# Patient Record
Sex: Male | Born: 1970 | Race: Black or African American | Hispanic: No | Marital: Married | Smoking: Never smoker
Health system: Southern US, Community
[De-identification: ages and names within clinical notes are randomized; demographics above are authoritative.]

## PROBLEM LIST (undated history)

## (undated) DIAGNOSIS — I10 Essential (primary) hypertension: Secondary | ICD-10-CM

## (undated) DIAGNOSIS — E785 Hyperlipidemia, unspecified: Secondary | ICD-10-CM

---

## 2016-08-05 ENCOUNTER — Emergency Department (HOSPITAL_COMMUNITY): Payer: PRIVATE HEALTH INSURANCE

## 2016-08-05 ENCOUNTER — Inpatient Hospital Stay (HOSPITAL_COMMUNITY)
Admission: EM | Admit: 2016-08-05 | Discharge: 2016-08-08 | DRG: 185 | Disposition: A | Discharge status: Home / Self Care | Payer: PRIVATE HEALTH INSURANCE | Attending: General Surgery | Admitting: General Surgery

## 2016-08-05 DIAGNOSIS — S2249XA Multiple fractures of ribs, unspecified side, initial encounter for closed fracture: Secondary | ICD-10-CM

## 2016-08-05 DIAGNOSIS — R52 Pain, unspecified: Secondary | ICD-10-CM | POA: Diagnosis not present

## 2016-08-05 DIAGNOSIS — Z7982 Long term (current) use of aspirin: Secondary | ICD-10-CM

## 2016-08-05 DIAGNOSIS — S80212A Abrasion, left knee, initial encounter: Secondary | ICD-10-CM | POA: Diagnosis present

## 2016-08-05 DIAGNOSIS — S2242XA Multiple fractures of ribs, left side, initial encounter for closed fracture: Secondary | ICD-10-CM | POA: Diagnosis not present

## 2016-08-05 DIAGNOSIS — Z79899 Other long term (current) drug therapy: Secondary | ICD-10-CM | POA: Diagnosis not present

## 2016-08-05 DIAGNOSIS — I10 Essential (primary) hypertension: Secondary | ICD-10-CM | POA: Diagnosis present

## 2016-08-05 DIAGNOSIS — S40212A Abrasion of left shoulder, initial encounter: Secondary | ICD-10-CM | POA: Diagnosis present

## 2016-08-05 DIAGNOSIS — Y92410 Unspecified street and highway as the place of occurrence of the external cause: Secondary | ICD-10-CM

## 2016-08-05 LAB — COMPREHENSIVE METABOLIC PANEL
ALBUMIN: 4.6 g/dL (ref 3.5–5.0)
ALT: 55 U/L (ref 17–63)
ANION GAP: 10 (ref 5–15)
AST: 50 U/L — ABNORMAL HIGH (ref 15–41)
Alkaline Phosphatase: 57 U/L (ref 38–126)
BUN: 12 mg/dL (ref 6–20)
CALCIUM: 9.8 mg/dL (ref 8.9–10.3)
CO2: 26 mmol/L (ref 22–32)
CREATININE: 1.34 mg/dL — AB (ref 0.61–1.24)
Chloride: 104 mmol/L (ref 101–111)
GFR calc Af Amer: >60 mL/min (ref >60)
GFR calc non Af Amer: >60 mL/min (ref >60)
GLUCOSE: 141 mg/dL — AB (ref 65–99)
POTASSIUM: 3.6 mmol/L (ref 3.5–5.1)
SODIUM: 140 mmol/L (ref 135–145)
TOTAL PROTEIN: 7.3 g/dL (ref 6.5–8.1)
Total Bilirubin: 1.1 mg/dL (ref 0.3–1.2)

## 2016-08-05 LAB — I-STAT CHEM 8, ED
BUN: 16 mg/dL (ref 6–20)
CHLORIDE: 104 mmol/L (ref 101–111)
CREATININE: 1.3 mg/dL — AB (ref 0.61–1.24)
Calcium, Ion: 1.1 mmol/L — ABNORMAL LOW (ref 1.15–1.40)
GLUCOSE: 141 mg/dL — AB (ref 65–99)
HEMATOCRIT: 44 % (ref 39.0–52.0)
HEMOGLOBIN: 15 g/dL (ref 13.0–17.0)
POTASSIUM: 3.6 mmol/L (ref 3.5–5.1)
Sodium: 141 mmol/L (ref 135–145)
TCO2: 26 mmol/L (ref 0–100)

## 2016-08-05 LAB — CBC
HCT: 41.6 % (ref 39.0–52.0)
HEMOGLOBIN: 14.1 g/dL (ref 13.0–17.0)
MCH: 27.8 pg (ref 26.0–34.0)
MCHC: 33.9 g/dL (ref 30.0–36.0)
MCV: 82.1 fL (ref 78.0–100.0)
PLATELETS: 147 10*3/uL — AB (ref 150–400)
RBC: 5.07 MIL/uL (ref 4.22–5.81)
RDW: 13.8 % (ref 11.5–15.5)
WBC: 14.6 10*3/uL — ABNORMAL HIGH (ref 4.0–10.5)

## 2016-08-05 LAB — SAMPLE TO BLOOD BANK

## 2016-08-05 LAB — I-STAT CG4 LACTIC ACID, ED: LACTIC ACID, VENOUS: 2.7 mmol/L — AB (ref 0.5–1.9)

## 2016-08-05 LAB — PROTIME-INR
INR: 1.01
Prothrombin Time: 13.3 seconds (ref 11.4–15.2)

## 2016-08-05 LAB — ETHANOL: Alcohol, Ethyl (B): <5 mg/dL (ref <5)

## 2016-08-05 MED ORDER — HYDROMORPHONE HCL 1 MG/ML IJ SOLN
INTRAMUSCULAR | Status: AC
  Filled 2016-08-05: qty 1

## 2016-08-05 MED ORDER — HYDROMORPHONE HCL 1 MG/ML IJ SOLN
1.0000 mg | Freq: Once | INTRAMUSCULAR | Status: AC
  Administered 2016-08-05: 1 mg via INTRAVENOUS

## 2016-08-05 MED ORDER — FENTANYL CITRATE (PF) 100 MCG/2ML IJ SOLN
INTRAMUSCULAR | Status: AC
  Administered 2016-08-05: 100 ug
  Filled 2016-08-05: qty 2

## 2016-08-05 MED ORDER — SODIUM CHLORIDE 0.9 % IV BOLUS (SEPSIS)
1000.0000 mL | Freq: Once | INTRAVENOUS | Status: AC
  Administered 2016-08-05: 1000 mL via INTRAVENOUS

## 2016-08-05 MED ORDER — IOPAMIDOL (ISOVUE-300) INJECTION 61%
INTRAVENOUS | Status: AC
  Administered 2016-08-05: 100 mL
  Filled 2016-08-05: qty 100

## 2016-08-05 NOTE — Progress Notes (Addendum)
Chaplain responded to page.  Patient being taken care of by care team.  No family with patient at this time.  Spiritual Care services remain available should anyone need assistance.    08/05/16 2019  Clinical Encounter Type  Visited With Patient;Patient not available;Health care provider  Visit Type Initial;Trauma
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
## 2016-08-05 NOTE — ED Notes (Signed)
Pt to CT
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
## 2016-08-06 LAB — BASIC METABOLIC PANEL
Anion gap: 10 (ref 5–15)
BUN: 12 mg/dL (ref 6–20)
CHLORIDE: 102 mmol/L (ref 101–111)
CO2: 26 mmol/L (ref 22–32)
CREATININE: 1.16 mg/dL (ref 0.61–1.24)
Calcium: 9.2 mg/dL (ref 8.9–10.3)
GFR calc Af Amer: >60 mL/min (ref >60)
GFR calc non Af Amer: >60 mL/min (ref >60)
GLUCOSE: 144 mg/dL — AB (ref 65–99)
POTASSIUM: 4.1 mmol/L (ref 3.5–5.1)
Sodium: 138 mmol/L (ref 135–145)

## 2016-08-06 LAB — URINALYSIS, ROUTINE W REFLEX MICROSCOPIC
BILIRUBIN URINE: NEGATIVE
GLUCOSE, UA: NEGATIVE mg/dL
Hgb urine dipstick: NEGATIVE
KETONES UR: NEGATIVE mg/dL
LEUKOCYTES UA: NEGATIVE
Nitrite: NEGATIVE
PROTEIN: NEGATIVE mg/dL
Specific Gravity, Urine: 1.024 (ref 1.005–1.030)
pH: 5 (ref 5.0–8.0)

## 2016-08-06 LAB — CBC
HCT: 39.6 % (ref 39.0–52.0)
HEMOGLOBIN: 13.8 g/dL (ref 13.0–17.0)
MCH: 28.7 pg (ref 26.0–34.0)
MCHC: 34.8 g/dL (ref 30.0–36.0)
MCV: 82.3 fL (ref 78.0–100.0)
Platelets: 133 10*3/uL — ABNORMAL LOW (ref 150–400)
RBC: 4.81 MIL/uL (ref 4.22–5.81)
RDW: 14.3 % (ref 11.5–15.5)
WBC: 11.1 10*3/uL — ABNORMAL HIGH (ref 4.0–10.5)

## 2016-08-06 LAB — HIV ANTIBODY (ROUTINE TESTING W REFLEX): HIV Screen 4th Generation wRfx: NONREACTIVE

## 2016-08-06 MED ORDER — ONDANSETRON HCL 4 MG PO TABS: 4.0000 mg | ORAL_TABLET | Freq: Four times a day (QID) | ORAL | Status: DC | PRN

## 2016-08-06 MED ORDER — OXYCODONE HCL 5 MG PO TABS: 10.0000 mg | ORAL_TABLET | ORAL | Status: DC | PRN

## 2016-08-06 MED ORDER — LOSARTAN POTASSIUM 50 MG PO TABS
100.0000 mg | ORAL_TABLET | Freq: Every day | ORAL | Status: DC
  Administered 2016-08-06 – 2016-08-07 (×2): 100 mg via ORAL
  Filled 2016-08-06 (×2): qty 2

## 2016-08-06 MED ORDER — SIMVASTATIN 40 MG PO TABS: 40.0000 mg | ORAL_TABLET | Freq: Every day | ORAL | Status: DC

## 2016-08-06 MED ORDER — AMLODIPINE BESYLATE 5 MG PO TABS
5.0000 mg | ORAL_TABLET | Freq: Every day | ORAL | Status: DC
  Administered 2016-08-06 – 2016-08-07 (×2): 5 mg via ORAL
  Filled 2016-08-06 (×2): qty 1

## 2016-08-06 MED ORDER — ASPIRIN EC 81 MG PO TBEC
81.0000 mg | DELAYED_RELEASE_TABLET | Freq: Every day | ORAL | Status: DC
  Administered 2016-08-06 – 2016-08-07 (×2): 81 mg via ORAL
  Filled 2016-08-06 (×2): qty 1

## 2016-08-06 MED ORDER — LOSARTAN POTASSIUM-HCTZ 100-12.5 MG PO TABS: 1.0000 | ORAL_TABLET | Freq: Every day | ORAL | Status: DC

## 2016-08-06 MED ORDER — ACETAMINOPHEN 325 MG PO TABS: 650.0000 mg | ORAL_TABLET | ORAL | Status: DC | PRN

## 2016-08-06 MED ORDER — MONTELUKAST SODIUM 10 MG PO TABS
10.0000 mg | ORAL_TABLET | Freq: Every day | ORAL | Status: DC
  Administered 2016-08-06 – 2016-08-07 (×2): 10 mg via ORAL
  Filled 2016-08-06 (×2): qty 1

## 2016-08-06 MED ORDER — OXYCODONE HCL 5 MG PO TABS: 5.0000 mg | ORAL_TABLET | ORAL | Status: DC | PRN

## 2016-08-06 MED ORDER — METHOCARBAMOL 500 MG PO TABS
1000.0000 mg | ORAL_TABLET | Freq: Four times a day (QID) | ORAL | Status: DC
  Administered 2016-08-06 – 2016-08-07 (×7): 1000 mg via ORAL
  Filled 2016-08-06 (×7): qty 2

## 2016-08-06 MED ORDER — HYDROMORPHONE HCL 1 MG/ML IJ SOLN
1.0000 mg | INTRAMUSCULAR | Status: DC | PRN
  Administered 2016-08-06 – 2016-08-07 (×5): 1 mg via INTRAVENOUS
  Filled 2016-08-06 (×5): qty 1

## 2016-08-06 MED ORDER — ATORVASTATIN CALCIUM 20 MG PO TABS
20.0000 mg | ORAL_TABLET | Freq: Every day | ORAL | Status: DC
  Administered 2016-08-06: 20 mg via ORAL
  Filled 2016-08-06: qty 1

## 2016-08-06 MED ORDER — HYDROCHLOROTHIAZIDE 12.5 MG PO CAPS
12.5000 mg | ORAL_CAPSULE | Freq: Every day | ORAL | Status: DC
  Administered 2016-08-06 – 2016-08-07 (×2): 12.5 mg via ORAL
  Filled 2016-08-06 (×2): qty 1

## 2016-08-06 MED ORDER — OXYCODONE HCL 5 MG PO TABS
5.0000 mg | ORAL_TABLET | ORAL | Status: DC | PRN
  Administered 2016-08-07: 5 mg via ORAL
  Administered 2016-08-07 – 2016-08-08 (×2): 10 mg via ORAL
  Filled 2016-08-06 (×4): qty 2

## 2016-08-06 MED ORDER — METHOCARBAMOL 1000 MG/10ML IJ SOLN
1000.0000 mg | Freq: Three times a day (TID) | INTRAVENOUS | Status: DC | PRN
  Administered 2016-08-06: 1000 mg via INTRAVENOUS
  Filled 2016-08-06 (×2): qty 10

## 2016-08-06 MED ORDER — ENOXAPARIN SODIUM 40 MG/0.4ML ~~LOC~~ SOLN
40.0000 mg | SUBCUTANEOUS | Status: DC
  Administered 2016-08-06: 40 mg via SUBCUTANEOUS
  Filled 2016-08-06: qty 0.4

## 2016-08-06 MED ORDER — ONDANSETRON HCL 4 MG/2ML IJ SOLN: 4.0000 mg | Freq: Four times a day (QID) | INTRAMUSCULAR | Status: DC | PRN

## 2016-08-06 MED ORDER — ACETAMINOPHEN 500 MG PO TABS
1000.0000 mg | ORAL_TABLET | Freq: Three times a day (TID) | ORAL | Status: DC
  Administered 2016-08-06 – 2016-08-08 (×6): 1000 mg via ORAL
  Filled 2016-08-06 (×6): qty 2

## 2016-08-06 MED ORDER — KCL IN DEXTROSE-NACL 20-5-0.45 MEQ/L-%-% IV SOLN
INTRAVENOUS | Status: DC
  Administered 2016-08-06 – 2016-08-07 (×3): via INTRAVENOUS
  Filled 2016-08-06 (×3): qty 1000

## 2016-08-06 MED ORDER — GABAPENTIN 600 MG PO TABS
300.0000 mg | ORAL_TABLET | Freq: Three times a day (TID) | ORAL | Status: DC
  Administered 2016-08-06 – 2016-08-07 (×6): 300 mg via ORAL
  Filled 2016-08-06 (×6): qty 1
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
## 2016-08-07 ENCOUNTER — Inpatient Hospital Stay (HOSPITAL_COMMUNITY): Payer: PRIVATE HEALTH INSURANCE

## 2016-08-07 MED ORDER — ENOXAPARIN SODIUM 80 MG/0.8ML ~~LOC~~ SOLN
65.0000 mg | SUBCUTANEOUS | Status: DC
  Administered 2016-08-07: 65 mg via SUBCUTANEOUS
  Filled 2016-08-07 (×2): qty 0.8
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
## 2016-08-07 NOTE — Progress Notes (Signed)
Physical Therapy Discharge Patient Details Name: Louis Martin MRN: [id number] DOB: 01/16/1971 Today's Date: 08/07/2016 Time: 1023-1048 PT Time Calculation (min) (ACUTE ONLY): 25 min  Patient discharged from PT services secondary to goals met and no further PT needs identified.  Please see latest therapy progress note for current level of functioning and progress toward goals.    Progress and discharge plan discussed with patient and/or caregiver: Patient/Caregiver agrees with plan  GP    Elizabeth B. Van Fleet PT, DPT Acute Rehabilitation  [phone] Pager [phone]   Elizabeth B Van Fleet 08/07/2016, 11:05 AM  

## 2016-08-08 MED ORDER — GABAPENTIN 600 MG PO TABS: 300.0000 mg | ORAL_TABLET | Freq: Three times a day (TID) | ORAL | 0 refills | Status: AC

## 2016-08-08 MED ORDER — METHOCARBAMOL 500 MG PO TABS: 1000.0000 mg | ORAL_TABLET | Freq: Four times a day (QID) | ORAL | 0 refills | Status: AC

## 2016-08-08 MED ORDER — ACETAMINOPHEN 500 MG PO TABS: 1000.0000 mg | ORAL_TABLET | Freq: Three times a day (TID) | ORAL | 1 refills | Status: AC

## 2016-08-08 MED ORDER — OXYCODONE HCL 5 MG PO TABS: 5.0000 mg | ORAL_TABLET | Freq: Three times a day (TID) | ORAL | 0 refills | Status: AC | PRN

## 2016-08-08 NOTE — Discharge Instructions (Signed)
Use Vaseline/Vaseline gauze on shoulder abrasion so that it does not stick to wound. Ok to keep open for periods during the day. Ok to shower with wounds open Monitor wound for spreading redness Ice/heat, elevate, and compression hose for lower extremity swelling Call with any questions or concerns
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
## 2016-08-08 NOTE — Discharge Planning (Signed)
Patient discharged home in stable condition. Verbalizes understanding of all discharge instructions, including home medications and follow up appointments. 

## 2016-08-24 ENCOUNTER — Ambulatory Visit (HOSPITAL_COMMUNITY)
Admission: RE | Admit: 2016-08-24 | Discharge: 2016-08-24 | Disposition: A | Discharge status: Home / Self Care | Payer: PRIVATE HEALTH INSURANCE | Source: Ambulatory Visit | Attending: Physician Assistant | Admitting: Physician Assistant

## 2016-08-24 ENCOUNTER — Other Ambulatory Visit (HOSPITAL_COMMUNITY): Payer: Self-pay | Admitting: Physician Assistant

## 2016-08-24 DIAGNOSIS — M79662 Pain in left lower leg: Secondary | ICD-10-CM | POA: Diagnosis present

## 2016-08-24 DIAGNOSIS — M7989 Other specified soft tissue disorders: Secondary | ICD-10-CM

## 2016-08-24 DIAGNOSIS — L989 Disorder of the skin and subcutaneous tissue, unspecified: Secondary | ICD-10-CM | POA: Diagnosis not present

## 2016-08-24 DIAGNOSIS — M79605 Pain in left leg: Secondary | ICD-10-CM | POA: Diagnosis not present

## 2016-08-24 NOTE — Progress Notes (Signed)
*  PRELIMINARY RESULTS* Vascular Ultrasound Left lower extremity venous duplex has been completed.  Preliminary findings: No evidence of deep vein thrombosis in the visualized veins of the left lower extremity.  Complex hypoechoic/heterogeneous lesion seen medial left calf, area of swelling- possible hematoma? No obvious baker's cyst seen in the left popliteal fossa.  Preliminary results called to [facility] Miller @ 11:45   Louis Martin 08/24/2016, 11:45 AM

## 2016-09-14 ENCOUNTER — Other Ambulatory Visit (HOSPITAL_COMMUNITY): Payer: Self-pay | Admitting: General Surgery

## 2016-09-14 DIAGNOSIS — M79605 Pain in left leg: Secondary | ICD-10-CM

## 2016-09-15 ENCOUNTER — Other Ambulatory Visit (HOSPITAL_COMMUNITY): Payer: Self-pay | Admitting: General Surgery

## 2016-09-15 ENCOUNTER — Emergency Department (HOSPITAL_COMMUNITY)
Admission: EM | Admit: 2016-09-15 | Discharge: 2016-09-15 | Disposition: A | Discharge status: Home / Self Care | Payer: PRIVATE HEALTH INSURANCE | Source: Home / Self Care

## 2016-09-15 ENCOUNTER — Ambulatory Visit (HOSPITAL_COMMUNITY)
Admission: RE | Admit: 2016-09-15 | Discharge: 2016-09-15 | Disposition: A | Discharge status: Home / Self Care | Payer: PRIVATE HEALTH INSURANCE | Source: Ambulatory Visit | Attending: General Surgery | Admitting: General Surgery

## 2016-09-15 ENCOUNTER — Encounter (HOSPITAL_COMMUNITY): Payer: Self-pay | Admitting: *Deleted

## 2016-09-15 ENCOUNTER — Telehealth (INDEPENDENT_AMBULATORY_CARE_PROVIDER_SITE_OTHER): Payer: Self-pay | Admitting: General Surgery

## 2016-09-15 DIAGNOSIS — Z5321 Procedure and treatment not carried out due to patient leaving prior to being seen by health care provider: Secondary | ICD-10-CM | POA: Insufficient documentation

## 2016-09-15 DIAGNOSIS — R600 Localized edema: Secondary | ICD-10-CM | POA: Insufficient documentation

## 2016-09-15 DIAGNOSIS — M79605 Pain in left leg: Secondary | ICD-10-CM

## 2016-09-15 HISTORY — DX: Hyperlipidemia, unspecified: E78.5

## 2016-09-15 HISTORY — DX: Essential (primary) hypertension: I10

## 2016-09-15 LAB — CBC WITH DIFFERENTIAL/PLATELET
BASOS ABS: 0 10*3/uL (ref 0.0–0.1)
BASOS PCT: 0 %
EOS ABS: 0.1 10*3/uL (ref 0.0–0.7)
EOS PCT: 1 %
HCT: 40.7 % (ref 39.0–52.0)
Hemoglobin: 13.7 g/dL (ref 13.0–17.0)
Lymphocytes Relative: 26 %
Lymphs Abs: 1.7 10*3/uL (ref 0.7–4.0)
MCH: 27.7 pg (ref 26.0–34.0)
MCHC: 33.7 g/dL (ref 30.0–36.0)
MCV: 82.4 fL (ref 78.0–100.0)
MONO ABS: 0.6 10*3/uL (ref 0.1–1.0)
Monocytes Relative: 10 %
Neutro Abs: 4.1 10*3/uL (ref 1.7–7.7)
Neutrophils Relative %: 63 %
PLATELETS: 171 10*3/uL (ref 150–400)
RBC: 4.94 MIL/uL (ref 4.22–5.81)
RDW: 13.8 % (ref 11.5–15.5)
WBC: 6.5 10*3/uL (ref 4.0–10.5)

## 2016-09-15 LAB — I-STAT CG4 LACTIC ACID, ED: Lactic Acid, Venous: 1.41 mmol/L (ref 0.5–1.9)

## 2016-09-15 LAB — COMPREHENSIVE METABOLIC PANEL
ALBUMIN: 4.3 g/dL (ref 3.5–5.0)
ALK PHOS: 130 U/L — AB (ref 38–126)
ALT: 51 U/L (ref 17–63)
AST: 33 U/L (ref 15–41)
Anion gap: 7 (ref 5–15)
BILIRUBIN TOTAL: 0.6 mg/dL (ref 0.3–1.2)
BUN: 12 mg/dL (ref 6–20)
CALCIUM: 9.6 mg/dL (ref 8.9–10.3)
CO2: 30 mmol/L (ref 22–32)
CREATININE: 1.31 mg/dL — AB (ref 0.61–1.24)
Chloride: 101 mmol/L (ref 101–111)
GFR calc Af Amer: >60 mL/min (ref >60)
GFR calc non Af Amer: >60 mL/min (ref >60)
GLUCOSE: 113 mg/dL — AB (ref 65–99)
Potassium: 4.1 mmol/L (ref 3.5–5.1)
Sodium: 138 mmol/L (ref 135–145)
TOTAL PROTEIN: 7.3 g/dL (ref 6.5–8.1)

## 2016-09-15 LAB — POCT I-STAT CREATININE: Creatinine, Ser: 0.9 mg/dL (ref 0.61–1.24)

## 2016-09-15 MED ORDER — GADOBENATE DIMEGLUMINE 529 MG/ML IV SOLN
20.0000 mL | Freq: Once | INTRAVENOUS | Status: AC | PRN
  Administered 2016-09-15: 20 mL via INTRAVENOUS

## 2016-09-15 MED ORDER — OXYCODONE HCL 5 MG PO TABS: 5.0000 mg | ORAL_TABLET | Freq: Four times a day (QID) | ORAL | 0 refills | Status: AC | PRN

## 2016-09-15 NOTE — Progress Notes (Signed)
Patient sent over from the office because an MRI scan was done which demonstrated possble cellulitis of the LLE.  Clinically he has pain in the left calf, but no clinical cellulitis.  He is afebrile.  He has not systemic symptoms..  He has a firm hematoma of the left calf and edema of the entire LLE below the knee.  This is a calf hematoma I evolution, but it does not appear to be infected or does not appear to have cellulitis.  Needs to keep the leg elevated above his heart, use a compression stocking, try to stay off of it as much as possible, and take pain medications as needed, preferably Tylenol and not NSAIDs.  Thie discoloration of his LLE is part of the evolving hematoma.  He has norma sensation , normal motor function and normal pulses.  He does not have a compartment syndrome.  He has no skin that appears to be at risk for necrosis.  Patient needs follow up and I will have him come in to see me on June 22 late morning.  Marta LamasJames O. Gae BonWyatt, III, MD, FACS [phone] Trauma Surgeon

## 2016-09-15 NOTE — Telephone Encounter (Signed)
Results of LLE MRI were concerning for cellulitis. Spoke with Dr. Derrell LollingIngram who [facility] by trauma surgeon on call. I called and spoke with the patients wife who stated she would take the pt to the ER this evening to have his LLE evaluated for cellulitis. Informed Dr. Lindie SpruceWyatt that the pt is on his way to the ER.   Louis MarlinJessica Devell Martin, [facility] Pager [phone]
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
## 2021-03-22 ENCOUNTER — Emergency Department (HOSPITAL_COMMUNITY)
Admission: EM | Admit: 2021-03-22 | Discharge: 2021-03-22 | Disposition: A | Discharge status: Home / Self Care | Payer: PRIVATE HEALTH INSURANCE | Attending: Emergency Medicine | Admitting: Emergency Medicine

## 2021-03-22 ENCOUNTER — Encounter (HOSPITAL_COMMUNITY): Payer: Self-pay | Admitting: Emergency Medicine

## 2021-03-22 ENCOUNTER — Other Ambulatory Visit: Payer: Self-pay

## 2021-03-22 ENCOUNTER — Emergency Department (HOSPITAL_COMMUNITY): Payer: Self-pay

## 2021-03-22 DIAGNOSIS — R739 Hyperglycemia, unspecified: Secondary | ICD-10-CM

## 2021-03-22 DIAGNOSIS — Z7982 Long term (current) use of aspirin: Secondary | ICD-10-CM | POA: Insufficient documentation

## 2021-03-22 DIAGNOSIS — R350 Frequency of micturition: Secondary | ICD-10-CM | POA: Insufficient documentation

## 2021-03-22 DIAGNOSIS — R1084 Generalized abdominal pain: Secondary | ICD-10-CM

## 2021-03-22 DIAGNOSIS — Z79899 Other long term (current) drug therapy: Secondary | ICD-10-CM | POA: Insufficient documentation

## 2021-03-22 DIAGNOSIS — R7989 Other specified abnormal findings of blood chemistry: Secondary | ICD-10-CM

## 2021-03-22 DIAGNOSIS — I10 Essential (primary) hypertension: Secondary | ICD-10-CM | POA: Insufficient documentation

## 2021-03-22 LAB — COMPREHENSIVE METABOLIC PANEL
ALT: 101 U/L — ABNORMAL HIGH (ref 0–44)
AST: 45 U/L — ABNORMAL HIGH (ref 15–41)
Albumin: 4.6 g/dL (ref 3.5–5.0)
Alkaline Phosphatase: 153 U/L — ABNORMAL HIGH (ref 38–126)
Anion gap: 15 (ref 5–15)
BUN: 22 mg/dL — ABNORMAL HIGH (ref 6–20)
CO2: 26 mmol/L (ref 22–32)
Calcium: 9.8 mg/dL (ref 8.9–10.3)
Chloride: 85 mmol/L — ABNORMAL LOW (ref 98–111)
Creatinine, Ser: 1.34 mg/dL — ABNORMAL HIGH (ref 0.61–1.24)
GFR, Estimated: >60 mL/min (ref >60)
Glucose, Bld: 618 mg/dL (ref 70–99)
Potassium: 4.4 mmol/L (ref 3.5–5.1)
Sodium: 126 mmol/L — ABNORMAL LOW (ref 135–145)
Total Bilirubin: 0.9 mg/dL (ref 0.3–1.2)
Total Protein: 8.4 g/dL — ABNORMAL HIGH (ref 6.5–8.1)

## 2021-03-22 LAB — CBC WITH DIFFERENTIAL/PLATELET
Abs Immature Granulocytes: 0.01 10*3/uL (ref 0.00–0.07)
Basophils Absolute: 0 10*3/uL (ref 0.0–0.1)
Basophils Relative: 0 %
Eosinophils Absolute: 0 10*3/uL (ref 0.0–0.5)
Eosinophils Relative: 1 %
HCT: 41.3 % (ref 39.0–52.0)
Hemoglobin: 14.1 g/dL (ref 13.0–17.0)
Immature Granulocytes: 0 %
Lymphocytes Relative: 21 %
Lymphs Abs: 1.5 10*3/uL (ref 0.7–4.0)
MCH: 25.6 pg — ABNORMAL LOW (ref 26.0–34.0)
MCHC: 34.1 g/dL (ref 30.0–36.0)
MCV: 75.1 fL — ABNORMAL LOW (ref 80.0–100.0)
Monocytes Absolute: 0.6 10*3/uL (ref 0.1–1.0)
Monocytes Relative: 8 %
Neutro Abs: 5 10*3/uL (ref 1.7–7.7)
Neutrophils Relative %: 70 %
Platelets: 152 10*3/uL (ref 150–400)
RBC: 5.5 MIL/uL (ref 4.22–5.81)
RDW: 15.7 % — ABNORMAL HIGH (ref 11.5–15.5)
WBC: 7.2 10*3/uL (ref 4.0–10.5)
nRBC: 0 % (ref 0.0–0.2)

## 2021-03-22 LAB — URINALYSIS, MICROSCOPIC (REFLEX)
Bacteria, UA: NONE SEEN
RBC / HPF: NONE SEEN RBC/hpf (ref 0–5)
Squamous Epithelial / HPF: NONE SEEN (ref 0–5)
WBC, UA: NONE SEEN WBC/hpf (ref 0–5)

## 2021-03-22 LAB — URINALYSIS, ROUTINE W REFLEX MICROSCOPIC
Bilirubin Urine: NEGATIVE
Glucose, UA: >=500 mg/dL — AB
Hgb urine dipstick: NEGATIVE
Ketones, ur: NEGATIVE mg/dL
Leukocytes,Ua: NEGATIVE
Nitrite: NEGATIVE
Protein, ur: NEGATIVE mg/dL
Specific Gravity, Urine: <1.005 — ABNORMAL LOW (ref 1.005–1.030)
pH: 5 (ref 5.0–8.0)

## 2021-03-22 LAB — CBG MONITORING, ED
Glucose-Capillary: 417 mg/dL — ABNORMAL HIGH (ref 70–99)
Glucose-Capillary: 423 mg/dL — ABNORMAL HIGH (ref 70–99)

## 2021-03-22 LAB — LIPASE, BLOOD: Lipase: 45 U/L (ref 11–51)

## 2021-03-22 MED ORDER — INSULIN ASPART PROT & ASPART (70-30 MIX) 100 UNIT/ML PEN
20.0000 [IU] | PEN_INJECTOR | Freq: Two times a day (BID) | SUBCUTANEOUS | 0 refills | Status: AC
Start: 2021-03-22 — End: ?

## 2021-03-22 MED ORDER — INSULIN ASPART 100 UNIT/ML IJ SOLN
18.0000 [IU] | Freq: Once | INTRAMUSCULAR | Status: AC
  Administered 2021-03-22: 07:00:00 18 [IU] via SUBCUTANEOUS
  Filled 2021-03-22: qty 1

## 2021-03-22 MED ORDER — FAMOTIDINE IN NACL 20-0.9 MG/50ML-% IV SOLN
20.0000 mg | Freq: Once | INTRAVENOUS | Status: AC
  Administered 2021-03-22: 03:00:00 20 mg via INTRAVENOUS
  Filled 2021-03-22: qty 50

## 2021-03-22 MED ORDER — MORPHINE SULFATE (PF) 4 MG/ML IV SOLN
4.0000 mg | Freq: Once | INTRAVENOUS | Status: AC
  Administered 2021-03-22: 03:00:00 4 mg via INTRAVENOUS
  Filled 2021-03-22: qty 1

## 2021-03-22 MED ORDER — SODIUM CHLORIDE 0.9 % IV BOLUS
500.0000 mL | Freq: Once | INTRAVENOUS | Status: AC
  Administered 2021-03-22: 07:00:00 500 mL via INTRAVENOUS

## 2021-03-22 MED ORDER — INSULIN STARTER KIT- SYRINGES (ENGLISH)
1.0000 | Freq: Once | Status: DC
  Filled 2021-03-22: qty 1

## 2021-03-22 MED ORDER — SODIUM CHLORIDE 0.9 % IV BOLUS
1000.0000 mL | Freq: Once | INTRAVENOUS | Status: AC
  Administered 2021-03-22: 05:00:00 1000 mL via INTRAVENOUS

## 2021-03-22 MED ORDER — BLOOD GLUCOSE MONITOR KIT: PACK | 0 refills | Status: AC

## 2021-03-22 MED ORDER — GLIPIZIDE 5 MG PO TABS: 5.0000 mg | ORAL_TABLET | Freq: Every day | ORAL | 1 refills | Status: AC

## 2021-03-22 MED ORDER — SODIUM CHLORIDE 0.9 % IV BOLUS
1000.0000 mL | Freq: Once | INTRAVENOUS | Status: AC
  Administered 2021-03-22: 04:00:00 1000 mL via INTRAVENOUS

## 2021-03-22 MED ORDER — METFORMIN HCL 500 MG PO TABS: 1000.0000 mg | ORAL_TABLET | Freq: Two times a day (BID) | ORAL | 1 refills | Status: AC

## 2021-03-22 MED ORDER — METFORMIN HCL 500 MG PO TABS: 500.0000 mg | ORAL_TABLET | Freq: Two times a day (BID) | ORAL | 1 refills | Status: DC

## 2021-03-22 MED ORDER — METFORMIN HCL 500 MG PO TABS: 500.0000 mg | ORAL_TABLET | Freq: Two times a day (BID) | ORAL | 6 refills | Status: DC

## 2021-03-22 MED ORDER — ONDANSETRON HCL 4 MG/2ML IJ SOLN
4.0000 mg | Freq: Once | INTRAMUSCULAR | Status: AC
  Administered 2021-03-22: 03:00:00 4 mg via INTRAVENOUS
  Filled 2021-03-22: qty 2

## 2021-03-22 MED ORDER — INSULIN ASPART 100 UNIT/ML IJ SOLN
10.0000 [IU] | Freq: Once | INTRAMUSCULAR | Status: AC
  Administered 2021-03-22: 05:00:00 10 [IU] via INTRAVENOUS
  Filled 2021-03-22: qty 1

## 2021-03-22 NOTE — ED Notes (Signed)
Blood glucose of 618 reported to Dr Blinda Leatherwood

## 2021-03-22 NOTE — ED Provider Notes (Addendum)
°  Physical Exam  BP 130/90    Pulse 85    Temp 97.8 F (36.6 C) (Oral)    Resp 13    Ht 6\' 1"  (1.854 m)    Wt 133 kg    SpO2 95%    BMI 38.68 kg/m   Physical Exam  ED Course/Procedures     Procedures  MDM   Patient was awaiting ultrasound with elevated LFTs. Ultrasound shows some liver disease pathology.  Results discussed with the patient.  He is stable for discharge. PCP follow-up recommended for repeat LFTs.    Marland Kitchen, MD 03/22/21 0851   9:55 AM  Inpatient Diabetes Recommendations per 03/24/21 RN:  Metformin 1000 mg BID Glipizide 5 mg BID 70/30 15 units BID per MD decision, F/U with PCP ASAP.  Patient says he is agreeable to insulin.  I confirmed - pt is in agreement. Has appt with pcp in Jan. Aware that ED is not ideal in insulin prescription, so he needs to check blood sugar frequently.   Strict ER return precautions have been discussed, and patient is agreeing with the plan and is comfortable with the workup done and the recommendations from the ER.      Feb, MD 03/22/21 [phone]

## 2021-03-22 NOTE — ED Triage Notes (Signed)
Pt c/o abd burning/pain x 2 days and frequent urination.

## 2021-03-22 NOTE — ED Notes (Signed)
Waiting on pharmacy to bring insulin start up kit

## 2021-03-22 NOTE — Discharge Instructions (Addendum)
For your DIABETES: Start taking the meds prescribed.  Monitor blood glucose as recommended. See your primary doctor with the blood sugar log, so they can further adjust your meds. ALSO - YOUR PRIMARY CARE DOCTOR NEEDS TO RECHECK YOUR LIVER ENZYMES.  Glucose Products:  ReliOn glucose products raise low blood sugar fast. Tablets are free of fat, caffeine, sodium and gluten. They are portable and easy to carry, making it easier for people with diabetes to BE PREPARED for lows.  Glucose Tablets Available in 6 flavors  10 ct...................................... $1.00  50 ct...................................... $3.98 Glucose Shot..................................$1.48 Glucose Gel....................................$3.44  Alcohol Swabs Alcohol swabs are used to sterilize your injection site. All of our swabs are individually wrapped for maximum safety, convenience and moisture retention. ReliOn Alcohol Swabs  100 ct Swabs..............................$1.00  400 ct Swabs..............................$3.74  Lancets ReliOn offers three lancet options conveniently designed to work with almost every lancing device. Each features a protective disk, which guarantees sterility before testing. ReliOn Lancets  100 ct Lancets $1.56  200 ct Lancets $2.64 Available in Ultra-Thin, Thin & Micro-Thin ReliOn 2-IN-1 Lancing Device  50 ct Lancets..................................... $3.44 Available in 30 gauge and 25 gauge ReliOn Lancing Device....................$5.84  Blood Glucose Monitors ReliOn offers a full range of blood glucose testing options to provide an accurate, affordable system that meets each person's unique needs and preferences. Prime Meter....................................... $9.00 Prime Test Strips  25 test strips.................................... $5.00  50 test strips.................................... $9.00  100 test strips.................................$17.88 Premier  [employer] Meter    $18.98 Premier Voice Meter  .  $14.98 Premier Test Strips  50 test strips.................................... $9.00  100 test strips.................................$17.88 Premier [employer] Kit    $19.44 Kit includes:  50 test strips  10 lancets  Lancing device  Carry case  Ketone Test Strips  50 test strips  ..  $6.64  Human Insulin  Novolin/ReliOn (recombinant DNA origin) is manufactured for [employer] by [employer] Insulin* with Vial..........$24.88 Available in 70/30 Novolin/ReliOn Insulin Pens*    $42.88 Available in 70/30  Insulin Delivery ReliOn syringes and pen needles provide precision technology, comfort and accuracy in insulin delivery at affordable prices. ReliOn Pen Needles*  50 ct....................................................$9.00 Available in 76m, 6316m 16m34m 59m64mliOn Insulin Syringes*  100 ct . $12.58 Available in 29G, 30G & 31G (3/10cc, 1/2cc & 1cc units)
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
## 2021-03-22 NOTE — Progress Notes (Signed)
Inpatient Diabetes Program Recommendations  AACE/ADA: New Consensus Statement on Inpatient Glycemic Control (2015)  Target Ranges:  Prepandial:   less than 140 mg/dL      Peak postprandial:   less than 180 mg/dL (1-2 hours)      Critically ill patients:  140 - 180 mg/dL   Lab Results  Component Value Date   GLUCAP 423 (H) 03/22/2021    Review of Glycemic Control  Latest Reference Range & Units 03/22/21 03:15  Glucose 70 - 99 mg/dL 466 (HH)  (HH): Data is critically high  Diabetes history:DM2 Outpatient Diabetes medications: Metformin   Inpatient Diabetes Recommendations:  Based on glucose trends and insulin requirements at this time would recommend-  Metformin 1000 mg BID Glipizide 5 mg BID 70/30 15 units BID per MD decision  Spoke with patient over the phone regarding his hyperglycemia.  He has been told in the past he has pre-DM.  He states he takes metformin at home.  Does not check his blood sugar.  Educated patient over the phone about diabetes, normal blood glucose, patho of DM, long and short term complications of diabetes, CHO's, importance of eliminating sugary beverages.    He has been having frequent urination and thirst.  He is current with his PCP in [place].  Has an appointment in January.  Given his blood sugar of > 600 mg/dL he will need insulin.  Discussed affordable insulin-70/30 at [facility].  Patient agreeable to insulin and will follow up with PCP.  Follow up with PCP ASAP; may need to call office and get an appointment this week Purchase a Relion glucometer at [facility] and check blood sugar at least once daily before breakfast and mid afternoon  Will continue to follow while inpatient.  Thank you, Dulce Sellar, RN, BSN Diabetes Coordinator Inpatient Diabetes Program [phone] (team pager from 8a-5p)

## 2024-04-02 ENCOUNTER — Emergency Department (HOSPITAL_COMMUNITY)

## 2024-04-02 ENCOUNTER — Emergency Department (HOSPITAL_COMMUNITY)
Admission: EM | Admit: 2024-04-02 | Discharge: 2024-04-02 | Disposition: A | Discharge status: Home / Self Care | Attending: Emergency Medicine | Admitting: Emergency Medicine

## 2024-04-02 DIAGNOSIS — R220 Localized swelling, mass and lump, head: Secondary | ICD-10-CM | POA: Diagnosis present

## 2024-04-02 DIAGNOSIS — Z7982 Long term (current) use of aspirin: Secondary | ICD-10-CM | POA: Diagnosis not present

## 2024-04-02 DIAGNOSIS — K1120 Sialoadenitis, unspecified: Secondary | ICD-10-CM | POA: Diagnosis not present

## 2024-04-02 DIAGNOSIS — J029 Acute pharyngitis, unspecified: Secondary | ICD-10-CM | POA: Diagnosis not present

## 2024-04-02 LAB — BASIC METABOLIC PANEL WITH GFR
Anion gap: 14 (ref 5–15)
BUN: 11 mg/dL (ref 6–20)
CO2: 25 mmol/L (ref 22–32)
Calcium: 9.1 mg/dL (ref 8.9–10.3)
Chloride: 100 mmol/L (ref 98–111)
Creatinine, Ser: 0.97 mg/dL (ref 0.61–1.24)
GFR, Estimated: >60 mL/min
Glucose, Bld: 120 mg/dL — ABNORMAL HIGH (ref 70–99)
Potassium: 3.7 mmol/L (ref 3.5–5.1)
Sodium: 138 mmol/L (ref 135–145)

## 2024-04-02 LAB — CBC WITH DIFFERENTIAL/PLATELET
Abs Immature Granulocytes: 0.02 K/uL (ref 0.00–0.07)
Basophils Absolute: 0 K/uL (ref 0.0–0.1)
Basophils Relative: 1 %
Eosinophils Absolute: 0 K/uL (ref 0.0–0.5)
Eosinophils Relative: 0 %
HCT: 40 % (ref 39.0–52.0)
Hemoglobin: 13.4 g/dL (ref 13.0–17.0)
Immature Granulocytes: 0 %
Lymphocytes Relative: 23 %
Lymphs Abs: 1.7 K/uL (ref 0.7–4.0)
MCH: 27.1 pg (ref 26.0–34.0)
MCHC: 33.5 g/dL (ref 30.0–36.0)
MCV: 80.8 fL (ref 80.0–100.0)
Monocytes Absolute: 0.7 K/uL (ref 0.1–1.0)
Monocytes Relative: 9 %
Neutro Abs: 5.1 K/uL (ref 1.7–7.7)
Neutrophils Relative %: 67 %
Platelets: 143 K/uL — ABNORMAL LOW (ref 150–400)
RBC: 4.95 MIL/uL (ref 4.22–5.81)
RDW: 13.9 % (ref 11.5–15.5)
Smear Review: NORMAL
WBC: 7.5 K/uL (ref 4.0–10.5)
nRBC: 0 % (ref 0.0–0.2)

## 2024-04-02 MED ORDER — IOHEXOL 300 MG/ML  SOLN
75.0000 mL | Freq: Once | INTRAMUSCULAR | Status: AC | PRN
  Administered 2024-04-02: 75 mL via INTRAVENOUS

## 2024-04-02 MED ORDER — AMOXICILLIN-POT CLAVULANATE 875-125 MG PO TABS: 1.0000 | ORAL_TABLET | Freq: Two times a day (BID) | ORAL | 0 refills | Status: AC

## 2024-04-02 NOTE — ED Provider Notes (Signed)
 " [place] EMERGENCY DEPARTMENT AT [facility] Provider Note   CSN: [id number] Arrival date & time: 04/02/24  1053     Patient presents with: Facial Swelling   Louis Martin is a 53 y.o. male.   53 year old male who presents emergency department left-sided neck pain and sore throat.  Patient reports that he started having left-sided neck pain 2 days ago.  Went to [place] and was diagnosed with an abscess.  Was started on amoxicillin and prednisone that he started last night but reports that swelling has gotten worse.  Had a pill get stuck in his throat decided to come into the emergency department for evaluation.  No difficulty breathing.       Prior to Admission medications  Medication Sig Start Date End Date Taking? Authorizing Provider  amoxicillin-clavulanate (AUGMENTIN) 875-125 MG tablet Take 1 tablet by mouth every 12 (twelve) hours. 04/02/24  Yes Yolande Lamar BROCKS, MD  acetaminophen  (TYLENOL ) 500 MG tablet Take 2 tablets (1,000 mg total) by mouth every 8 (eight) hours. 08/08/16   Meuth, Brooke A, PA-C  amLODipine  (NORVASC ) 5 MG tablet Take 5 mg by mouth daily.    [provider]  aspirin  EC 81 MG tablet Take 81 mg by mouth daily.    [provider]  blood glucose meter kit and supplies KIT Dispense based on patient and insurance preference. Use up to four times daily as directed. 03/22/21   Haze Lonni PARAS, MD  gabapentin  (NEURONTIN ) 600 MG tablet Take 0.5 tablets (300 mg total) by mouth 3 (three) times daily. 08/08/16   Meuth, Brooke A, PA-C  glipiZIDE  (GLUCOTROL ) 5 MG tablet Take 1 tablet (5 mg total) by mouth daily before breakfast. 03/22/21   Charlyn Sora, MD  insulin  aspart protamine - aspart (NOVOLOG  70/30 MIX) (70-30) 100 UNIT/ML FlexPen Inject 20 Units into the skin 2 (two) times daily. 03/22/21   Charlyn Sora, MD  losartan -hydrochlorothiazide  (HYZAAR) 100-12.5 MG tablet Take 1 tablet by mouth daily.    [provider]   metFORMIN  (GLUCOPHAGE ) 500 MG tablet Take 2 tablets (1,000 mg total) by mouth 2 (two) times daily with a meal. 03/22/21   Charlyn Sora, MD  methocarbamol  (ROBAXIN ) 500 MG tablet Take 2 tablets (1,000 mg total) by mouth 4 (four) times daily. 08/08/16   Meuth, Brooke A, PA-C  montelukast  (SINGULAIR ) 10 MG tablet Take 10 mg by mouth at bedtime.    [provider]  oxyCODONE  (OXY IR/ROXICODONE ) 5 MG immediate release tablet Take 1-2 tablets (5-10 mg total) by mouth every 8 (eight) hours as needed for moderate pain, severe pain or breakthrough pain. 08/08/16   Meuth, Brooke A, PA-C  oxyCODONE  (OXY IR/ROXICODONE ) 5 MG immediate release tablet Take 1-2 tablets (5-10 mg total) by mouth every 6 (six) hours as needed for moderate pain, severe pain or breakthrough pain. 09/15/16   Kimble Lynwood, MD  simvastatin  (ZOCOR ) 40 MG tablet Take 40 mg by mouth daily.    [provider]    Allergies: Amlodipine  and Pioglitazone    Review of Systems  Updated Vital Signs BP (!) 142/80 (BP Location: Left Arm)   Pulse 89   Temp 98.6 F (37 C) (Oral)   Resp 14   Ht 6' 1 (1.854 m)   Wt (!) 141.1 kg   SpO2 96%   BMI 41.03 kg/m   Physical Exam Constitutional:      Appearance: Normal appearance.     Comments: Tolerating secretions able to drink fluids  HENT:  Head: Normocephalic and atraumatic.     Comments: Multiple missing teeth but no tenderness to palpation of his teeth.  No gingival abscess palpated.  No brawny edema under the tongue or elevation of the tongue.  Swelling underneath the left mandible.    Right Ear: External ear normal.     Left Ear: External ear normal.     Nose: Nose normal.     Mouth/Throat:     Mouth: Mucous membranes are moist.     Pharynx: Oropharynx is clear.   Eyes:     Extraocular Movements: Extraocular movements intact.     Conjunctiva/sclera: Conjunctivae normal.     Pupils: Pupils are equal, round, and reactive to light.  Pulmonary:     Effort:  Pulmonary effort is normal.     Breath sounds: No stridor.  Musculoskeletal:     Cervical back: Normal range of motion and neck supple.  Neurological:     Mental Status: He is alert.     (all labs ordered are listed, but only abnormal results are displayed) Labs Reviewed  CBC WITH DIFFERENTIAL/PLATELET - Abnormal; Notable for the following components:      Result Value   Platelets 143 (*)    All other components within normal limits  BASIC METABOLIC PANEL WITH GFR - Abnormal; Notable for the following components:   Glucose, Bld 120 (*)    All other components within normal limits    EKG: None  Radiology: CT Soft Tissue Neck W Contrast Result Date: 04/02/2024 EXAM: CT NECK WITH CONTRAST 04/02/2024 12:27:05 PM TECHNIQUE: CT of the neck was performed with the administration of 75 mL of iohexol (OMNIPAQUE) 300 MG/ML solution. Multiplanar reformatted images are provided for review. Automated exposure control, iterative reconstruction, and/or weight based adjustment of the mA/kV was utilized to reduce the radiation dose to as low as reasonably achievable. COMPARISON: None available. CLINICAL HISTORY: facial swelling and pain on L. FINDINGS: AERODIGESTIVE TRACT: There is edema and soft tissue swelling involving the left palatine tonsils and oropharyngeal and hypopharyngeal mucosa. There is no enhancing fluid collection present, but the findings are compatible with tonsillitis/pharyngitis. SALIVARY GLANDS: The left submandibular gland is swollen and demonstrates heterogeneous enhancement. The parotid glands and right submandibular gland are unremarkable. THYROID: Unremarkable. LYMPH NODES: No suspicious cervical lymphadenopathy. SOFT TISSUES: No mass or fluid collection. There is no evidence of odontogenic infection. BONES: The mandibular incisors and the medial maxillary incisors are absent. OTHER: Visualized mastoid air cells are well aerated. There is mucosal disease present within the floors of  the maxillary sinuses. Visualized lungs are clear. IMPRESSION: 1. Edema and soft tissue swelling involving the left palatine tonsil and oropharyngeal/hypopharyngeal mucosa, compatible with tonsillitis/pharyngitis. No enhancing fluid collection. 2. Swollen left submandibular gland with heterogeneous enhancement, compatible with sialadenitis. 3. Mucosal disease within the floors of the maxillary sinuses. Electronically signed by: Evalene Coho MD 04/02/2024 12:50 PM EST RP Workstation: [id number]     Procedures   Medications Ordered in the ED  iohexol (OMNIPAQUE) 300 MG/ML solution 75 mL (75 mLs Intravenous Contrast Given 04/02/24 1216)                                    Medical Decision Making Amount and/or Complexity of Data Reviewed Labs: ordered. Radiology: ordered.  Risk Prescription drug management.   Baldo Hufnagle is a 53 year old male who presents emergency department with left-sided throat pain and jaw pain  Initial  Ddx:  Ludwig's angina, dental infection, periapical abscess, sialadenitis, peritonsillar abscess, malignancy  MDM/Course:  Patient presents emergency department several days of a sore throat.  Also is having a sore jaw.  Says that medication got stuck in his throat today and so he decided to come into the emergency department.  Was seen at [facility] and had an evaluation that was concerning for an abscess of some sort in his head or neck and was started on amoxicillin and prednisone.  He says that one of the pills got stuck in his throat today and he feels like the swelling may be a little bit worse so he decided to come in.  On exam he is tolerating his secretions.  There is no stridor or concerns for airway compromise at this point in time.  Does have some swelling underneath his jaw and the back of his throat near his tonsil but no obvious signs of Ludwig's angina.  Had a CT scan that shows sialoadenitis on the left side as well as tonsillar hypertrophy without a  discrete abscess.  Will change him over to Augmentin which can be crushed.  Also counseled to crush the prednisone since he is having some difficulty swallowing these pills.  Since he is tolerating p.o. at this point in time and does not have any airway concerns do not feel that he needs admission.  Gave him information for ENT to follow-up  This patient presents to the ED for concern of complaints listed in HPI, this involves an extensive number of treatment options, and is a complaint that carries with it a high risk of complications and morbidity. Disposition including potential need for admission considered.   Dispo: DC Home. Return precautions discussed including, but not limited to, those listed in the AVS. Allowed pt time to ask questions which were answered fully prior to dc.  I have reviewed the patients home medications and made adjustments as needed Records reviewed Outpatient Clinic Notes The following labs were independently interpreted: Chemistry and show no acute abnormality I independently reviewed the following imaging with scope of interpretation limited to determining acute life threatening conditions related to emergency care: CT neck and agree with the radiologist interpretation with the following exceptions: none  Portions of this note were generated with Dragon dictation software. Dictation errors may occur despite best attempts at proofreading.     Final diagnoses:  Sialadenitis  Pharyngitis, unspecified etiology    ED Discharge Orders          Ordered    amoxicillin-clavulanate (AUGMENTIN) 875-125 MG tablet  Every 12 hours        04/02/24 1327               Yolande Lamar BROCKS, MD 04/02/24 1619  "

## 2024-04-02 NOTE — ED Triage Notes (Signed)
 Pt states that two days ago he began having the feeling of L lower facial swelling. He was seen yesterday at hospital yesterday and diagnosed with potential tooth abscess. Pt given amoxicillin and prednisone at discharge and today when taking the medications he felt a globus sensation. States sensation has passed, but feels as though swelling is increased and he feels as though his throat is closing. Pt talking in complete sentences for triage. NAD noted at this time.

## 2024-04-02 NOTE — ED Notes (Signed)
 Pt/family received d/c paperwork at this time. After going over the paperwork any questions, comments, or concerns were answered to the best of this nurse's knowledge. The pt/family verbally acknowledged the teachings/instructions.

## 2024-04-02 NOTE — ED Provider Notes (Signed)
 No known hx of renal dysfunction. Has had labs in the past that showed normal renal function. Can waive labs for CT scan.    Yolande Lamar BROCKS, MD 04/02/24 [phone]

## 2024-04-02 NOTE — Discharge Instructions (Addendum)
 You were seen for your infected salivary gland in the emergency department.   At home, please use sour lozenges, lemon drops, or lemon juice to increase salivation to help pass the stone.    Take the antibiotics you were prescribed.  Your Augmentin and the prednisone can be crushed.  Check your MyChart online for the results of any tests that had not resulted by the time you left the emergency department.   Follow-up with ENT in the next 2 to 3 days.  They have been contacted about scheduling you an appointment  Return immediately to the emergency department if you experience any of the following: Worsening pain, high fevers, difficulty breathing, or any other concerning symptoms.    Thank you for visiting our Emergency Department. It was a pleasure taking care of you today.
